# Patient Record
Sex: Male | Born: 1990 | Race: White | Hispanic: No | Marital: Single | Smoking: Never smoker
Health system: Southern US, Community
[De-identification: ages and names within clinical notes are randomized; demographics above are authoritative.]

## PROBLEM LIST (undated history)

## (undated) DIAGNOSIS — K289 Gastrojejunal ulcer, unspecified as acute or chronic, without hemorrhage or perforation: Secondary | ICD-10-CM

---

## 2014-04-01 ENCOUNTER — Emergency Department (HOSPITAL_COMMUNITY): Payer: Federal, State, Local not specified - PPO

## 2014-04-01 ENCOUNTER — Encounter (HOSPITAL_COMMUNITY): Payer: Self-pay | Admitting: Emergency Medicine

## 2014-04-01 ENCOUNTER — Emergency Department (HOSPITAL_COMMUNITY)
Admission: EM | Admit: 2014-04-01 | Discharge: 2014-04-01 | Disposition: A | Discharge status: Home / Self Care | Payer: Federal, State, Local not specified - PPO | Attending: Emergency Medicine | Admitting: Emergency Medicine

## 2014-04-01 DIAGNOSIS — R1110 Vomiting, unspecified: Secondary | ICD-10-CM | POA: Diagnosis not present

## 2014-04-01 DIAGNOSIS — Z8719 Personal history of other diseases of the digestive system: Secondary | ICD-10-CM | POA: Insufficient documentation

## 2014-04-01 DIAGNOSIS — Z79899 Other long term (current) drug therapy: Secondary | ICD-10-CM | POA: Insufficient documentation

## 2014-04-01 HISTORY — DX: Gastrojejunal ulcer, unspecified as acute or chronic, without hemorrhage or perforation: K28.9

## 2014-04-01 LAB — COMPREHENSIVE METABOLIC PANEL
ALT: 152 U/L — ABNORMAL HIGH (ref 0–53)
ANION GAP: 14 (ref 5–15)
AST: 99 U/L — AB (ref 0–37)
Albumin: 5.4 g/dL — ABNORMAL HIGH (ref 3.5–5.2)
Alkaline Phosphatase: 77 U/L (ref 39–117)
BUN: 27 mg/dL — AB (ref 6–23)
CALCIUM: 10.5 mg/dL (ref 8.4–10.5)
CO2: 34 mEq/L — ABNORMAL HIGH (ref 19–32)
Chloride: 93 mEq/L — ABNORMAL LOW (ref 96–112)
Creatinine, Ser: 1.28 mg/dL (ref 0.50–1.35)
GFR calc Af Amer: >90 mL/min (ref >90)
GFR, EST NON AFRICAN AMERICAN: 78 mL/min — AB (ref >90)
Glucose, Bld: 116 mg/dL — ABNORMAL HIGH (ref 70–99)
Potassium: 4.2 mEq/L (ref 3.7–5.3)
Sodium: 141 mEq/L (ref 137–147)
Total Bilirubin: 1 mg/dL (ref 0.3–1.2)
Total Protein: 9 g/dL — ABNORMAL HIGH (ref 6.0–8.3)

## 2014-04-01 LAB — URINALYSIS, ROUTINE W REFLEX MICROSCOPIC
Glucose, UA: NEGATIVE mg/dL
Hgb urine dipstick: NEGATIVE
KETONES UR: NEGATIVE mg/dL
NITRITE: NEGATIVE
PH: 6 (ref 5.0–8.0)
PROTEIN: NEGATIVE mg/dL
Specific Gravity, Urine: 1.029 (ref 1.005–1.030)
UROBILINOGEN UA: 1 mg/dL (ref 0.0–1.0)

## 2014-04-01 LAB — I-STAT CHEM 8, ED
BUN: 29 mg/dL — AB (ref 6–23)
CHLORIDE: 94 meq/L — AB (ref 96–112)
CREATININE: 1.3 mg/dL (ref 0.50–1.35)
Calcium, Ion: 1.07 mmol/L — ABNORMAL LOW (ref 1.12–1.23)
GLUCOSE: 100 mg/dL — AB (ref 70–99)
HCT: 56 % — ABNORMAL HIGH (ref 39.0–52.0)
Hemoglobin: 19 g/dL — ABNORMAL HIGH (ref 13.0–17.0)
POTASSIUM: 3.5 meq/L — AB (ref 3.7–5.3)
Sodium: 138 mEq/L (ref 137–147)
TCO2: 35 mmol/L (ref 0–100)

## 2014-04-01 LAB — URINE MICROSCOPIC-ADD ON

## 2014-04-01 LAB — CBC WITH DIFFERENTIAL/PLATELET
BASOS PCT: 0 % (ref 0–1)
Basophils Absolute: 0 10*3/uL (ref 0.0–0.1)
EOS PCT: 0 % (ref 0–5)
Eosinophils Absolute: 0 10*3/uL (ref 0.0–0.7)
HEMATOCRIT: 47.7 % (ref 39.0–52.0)
Hemoglobin: 16.5 g/dL (ref 13.0–17.0)
LYMPHS PCT: 21 % (ref 12–46)
Lymphs Abs: 1.3 10*3/uL (ref 0.7–4.0)
MCH: 30.4 pg (ref 26.0–34.0)
MCHC: 34.6 g/dL (ref 30.0–36.0)
MCV: 88 fL (ref 78.0–100.0)
MONO ABS: 0.5 10*3/uL (ref 0.1–1.0)
Monocytes Relative: 8 % (ref 3–12)
Neutro Abs: 4.4 10*3/uL (ref 1.7–7.7)
Neutrophils Relative %: 71 % (ref 43–77)
Platelets: 332 10*3/uL (ref 150–400)
RBC: 5.42 MIL/uL (ref 4.22–5.81)
RDW: 12.5 % (ref 11.5–15.5)
WBC: 6.3 10*3/uL (ref 4.0–10.5)

## 2014-04-01 LAB — LIPASE, BLOOD: Lipase: 37 U/L (ref 11–59)

## 2014-04-01 MED ORDER — SODIUM CHLORIDE 0.9 % IV BOLUS (SEPSIS)
1000.0000 mL | Freq: Once | INTRAVENOUS | Status: AC
  Administered 2014-04-01: 1000 mL via INTRAVENOUS

## 2014-04-01 MED ORDER — HYDROCODONE-ACETAMINOPHEN 5-325 MG PO TABS: 1.0000 | ORAL_TABLET | Freq: Four times a day (QID) | ORAL | Status: AC | PRN

## 2014-04-01 MED ORDER — ONDANSETRON HCL 4 MG/2ML IJ SOLN
4.0000 mg | Freq: Once | INTRAMUSCULAR | Status: AC
  Administered 2014-04-01: 4 mg via INTRAVENOUS
  Filled 2014-04-01: qty 2

## 2014-04-01 MED ORDER — SUCRALFATE 1 G PO TABS: 1.0000 g | ORAL_TABLET | Freq: Three times a day (TID) | ORAL | Status: AC

## 2014-04-01 MED ORDER — IOHEXOL 300 MG/ML  SOLN
50.0000 mL | Freq: Once | INTRAMUSCULAR | Status: AC | PRN
  Administered 2014-04-01: 50 mL via ORAL

## 2014-04-01 MED ORDER — OMEPRAZOLE 20 MG PO CPDR: 20.0000 mg | DELAYED_RELEASE_CAPSULE | Freq: Two times a day (BID) | ORAL | Status: AC

## 2014-04-01 MED ORDER — IOHEXOL 300 MG/ML  SOLN
100.0000 mL | Freq: Once | INTRAMUSCULAR | Status: AC | PRN
  Administered 2014-04-01: 100 mL via INTRAVENOUS

## 2014-04-01 MED ORDER — ONDANSETRON HCL 4 MG PO TABS: 4.0000 mg | ORAL_TABLET | Freq: Four times a day (QID) | ORAL | Status: AC

## 2014-04-01 MED ORDER — MORPHINE SULFATE 4 MG/ML IJ SOLN: 4.0000 mg | Freq: Once | INTRAMUSCULAR | Status: DC

## 2014-04-01 NOTE — ED Provider Notes (Signed)
CSN: [id number]     Arrival date & time 04/01/14  1057 History   First MD Initiated Contact with Patient 04/01/14 1105     Chief Complaint  Patient presents with  . Emesis     (Consider location/radiation/quality/duration/timing/severity/associated sxs/prior Treatment) HPI  Patient to the ED with complaints of vomiting, dehydration and inability to void since Thursday (5 days). He says that he has had some mild abdominal pain typically associated with episodes of vomiting. He says that he does okay when he is not eating but anything he tries to eat or drink comes back up. He reports having a hx of the same and being told that he has a stomach ulcer. He is unsure if this feels like the same. Triage nurse notes that he completed a can of Ginger Ale when called to come to the back and through away the empty can. Pt does not appear to be in distress and is resting comfortably.  Vital signs are unremarkable.  Past Medical History  Diagnosis Date  . Ulcer of the stomach and intestine    History reviewed. No pertinent past surgical history. No family history on file. History  Substance Use Topics  . Smoking status: Never Smoker   . Smokeless tobacco: Current User    Types: Chew  . Alcohol Use: Yes     Comment: weekends    Review of Systems  10 Systems reviewed and are negative for acute change except as noted in the HPI.    Allergies  Shellfish allergy  Home Medications   Prior to Admission medications   Medication Sig Start Date End Date Taking? Authorizing Provider  alum & mag hydroxide-simeth (MAALOX/MYLANTA) 200-200-20 MG/5ML suspension Take 30 mLs by mouth every 6 (six) hours as needed for indigestion or heartburn (indigestion).   Yes Historical Provider, MD  ibuprofen (ADVIL,MOTRIN) 200 MG tablet Take 200 mg by mouth 3 (three) times daily as needed for moderate pain (stomach pain).   Yes Historical Provider, MD  methocarbamol (ROBAXIN) 500 MG tablet Take 500 mg by mouth 4  (four) times daily.   Yes Historical Provider, MD  omeprazole (PRILOSEC) 20 MG capsule Take 20 mg by mouth 2 (two) times daily before a meal.   Yes Historical Provider, MD  ondansetron (ZOFRAN-ODT) 8 MG disintegrating tablet Take 8 mg by mouth every 8 (eight) hours as needed for nausea or vomiting (nausea & vomiting).   Yes Historical Provider, MD   BP 110/92 mmHg  Pulse 87  Temp(Src) 98.1 F (36.7 C) (Oral)  Resp 18  Ht 6' (1.829 m)  Wt 149 lb (67.586 kg)  BMI 20.20 kg/m2  SpO2 100% Physical Exam  Constitutional: He appears well-developed and well-nourished. No distress.  HENT:  Head: Normocephalic and atraumatic.  Mouth/Throat: Mucous membranes are dry.  Eyes: Pupils are equal, round, and reactive to light.  Neck: Normal range of motion. Neck supple.  Cardiovascular: Normal rate and regular rhythm.   Pulmonary/Chest: Effort normal.  Abdominal: Soft. Bowel sounds are normal. He exhibits no distension and no fluid wave. There is no tenderness. There is no rigidity, no rebound, no guarding and no CVA tenderness.  Neurological: He is alert.  Skin: Skin is warm and dry.  Nursing note and vitals reviewed.   ED Course  Procedures (including critical care time) Labs Review Labs Reviewed  COMPREHENSIVE METABOLIC PANEL - Abnormal; Notable for the following:    Chloride 93 (*)    CO2 34 (*)    Glucose, Bld 116 (*)  BUN 27 (*)    Total Protein 9.0 (*)    Albumin 5.4 (*)    AST 99 (*)    ALT 152 (*)    GFR calc non Af Amer 78 (*)    All other components within normal limits  URINALYSIS, ROUTINE W REFLEX MICROSCOPIC - Abnormal; Notable for the following:    Color, Urine AMBER (*)    Bilirubin Urine SMALL (*)    Leukocytes, UA TRACE (*)    All other components within normal limits  URINE MICROSCOPIC-ADD ON - Abnormal; Notable for the following:    Bacteria, UA FEW (*)    All other components within normal limits  I-STAT CHEM 8, ED - Abnormal; Notable for the following:     Potassium 3.5 (*)    Chloride 94 (*)    BUN 29 (*)    Glucose, Bld 100 (*)    Calcium, Ion 1.07 (*)    Hemoglobin 19.0 (*)    HCT 56.0 (*)    All other components within normal limits  CBC WITH DIFFERENTIAL  LIPASE, BLOOD    Imaging Review [place] Abdomen Complete  04/01/2014   CLINICAL DATA:  23 year old male with acute vomiting. Acute abdominal pain for 4 days. Initial encounter.  EXAM: ULTRASOUND ABDOMEN COMPLETE  COMPARISON:  None.  FINDINGS: Gallbladder: Contracted. Wall thickness at the upper limits of normal, 3 mm. No pericholecystic fluid. No cholelithiasis identified. No sonographic Murphy sign elicited.  Common bile duct: Diameter: 2 mm, normal  Liver: No focal lesion identified. Within normal limits in parenchymal echogenicity.  IVC: No abnormality visualized.  Pancreas: Visualized portion unremarkable.  Spleen: Size and appearance within normal limits.  Right Kidney: Length: 10.0 cm. Echogenicity within normal limits. No mass or hydronephrosis visualized.  Left Kidney: Length: 11.3 cm. Echogenicity within normal limits. No mass or hydronephrosis visualized.  Abdominal aorta: No aneurysm visualized.  Other findings: None.  IMPRESSION: Contracted but otherwise normal gallbladder. Negative abdominal ultrasound.   Electronically Signed   By: Augusto GambleLee  Hall M.D.   On: 04/01/2014 14:07   ;l  EKG Interpretation None      MDM   Final diagnoses:  Vomiting   12: 30 pm Patient given 2 L of fluids. His CBC and Lipase are unremarkable however his CMP is abnormal. He has elevated LFTs, Albumin is elevated, CO2 is elevated, as well as BUN. Will obtain US abdomen for further evaluation to evaluate pancreas and gallbladder.  2: 59 pm The US of the abdomen is unremarkable. He is now complaining of some suprapubic abdominal pain. Will order CT abd/pelv w/ contrast.  At end of shift patient hand out to [employer] Browning, PA-C.  If the patients CT scan is normal and he is able to tolerate PO and feeling  better I feel that he can go home. If he continues to vomit, cannot tolerate PO or has significant CT abd/pelv findings that I would treat accordingly.    Dorthula Matasiffany G Sherif Millspaugh, PA-C 04/01/14 1502

## 2014-04-01 NOTE — ED Notes (Signed)
Neva SeatGreene, PA notified of pt's comment about his urine. Neva SeatGreene, PA at bedside.

## 2014-04-01 NOTE — Discharge Instructions (Signed)

## 2014-04-01 NOTE — ED Notes (Signed)
Pt states he is unable to give urine sample at this time due to the fact he has not been able to keep water down. Pt states yesterday he urinated a few drops of brown urine.

## 2014-04-01 NOTE — ED Provider Notes (Signed)
3:40 PM Patient signed out to me by Neva SeatGreene, PA-C.  Patient with RLQ pain and vomiting.  CT abd is pending.  DC if negative and tolerating oral intake.  4:25 PM Patient reassessed.  Feeling better.  Abdomen is soft and non tender.  Patient has history of ulcers, states that this feels similar.  Will DC with carafate, omeprazole, and zofran.  Patient is tolerating orals here.  DC to home.  Roxy Horsemanobert Dahiana Kulak, PA-C 04/01/14 1625

## 2014-04-01 NOTE — ED Notes (Signed)
Pt states that he hasnt been able to tolerate water or foods since Thursday night. Pt finished off ginger ale and threw in trash when called pt from lobby.  Pt denies diarrhea.

## 2015-06-21 IMAGING — CT CT ABD-PELV W/ CM
1 of 2 series · 15 of 32 positions shown, 19 images · IV contrast (OMNIPAQUE 300)
Comparison: Ultrasound of the abdomen performed earlier today.

CLINICAL DATA: Vomiting, dehydration, and inability to void. Mid
abdominal pain.

EXAM:
CT ABDOMEN AND PELVIS WITH CONTRAST
TECHNIQUE: Multidetector CT imaging of the abdomen and pelvis was performed
using the standard protocol following bolus administration of
intravenous contrast.
CONTRAST:  50mL OMNIPAQUE IOHEXOL 300 MG/ML SOLN, 100mL OMNIPAQUE
IOHEXOL 300 MG/ML SOLN

[Series 2: abd/pel with · axial · 0.69mm/px · z∈[-486,-71]mm · 15 of 91 slices shown, 19 images]
[im 4/91  soft-tissue]
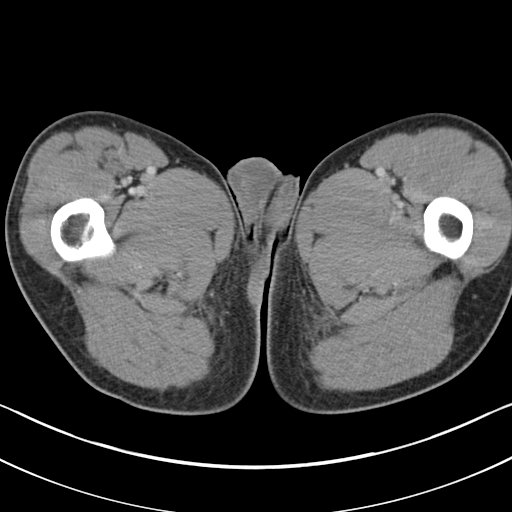
[im 4/91  bone]
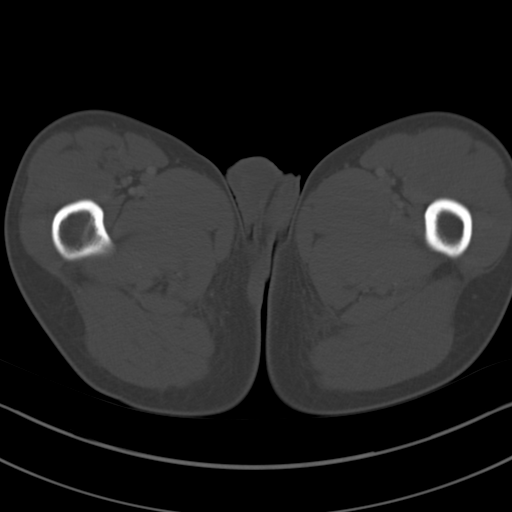
[im 11/91  soft-tissue]
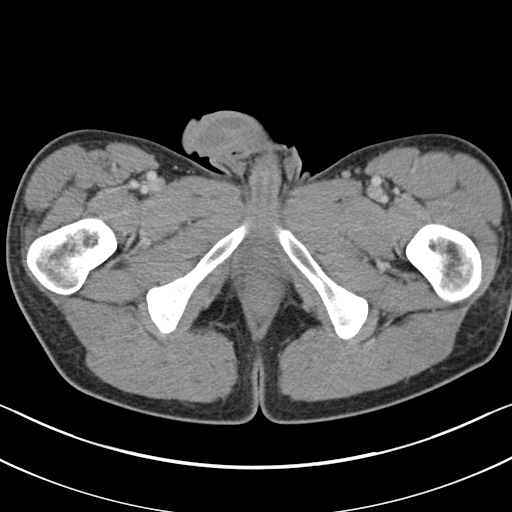
[im 19/91  soft-tissue]
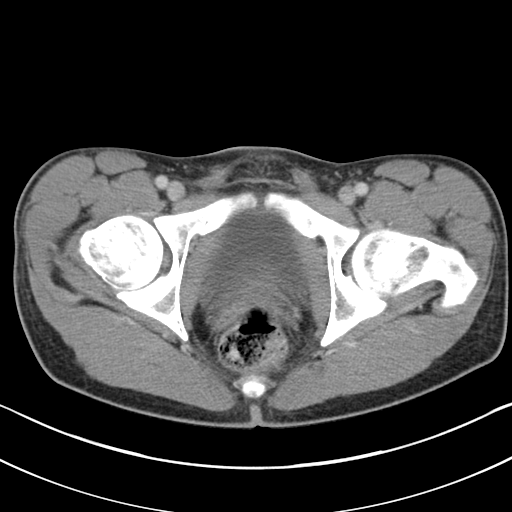
[im 26/91  soft-tissue]
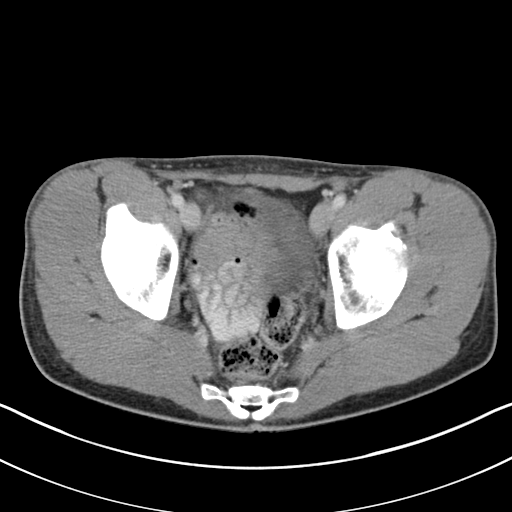
[im 33/91  soft-tissue]
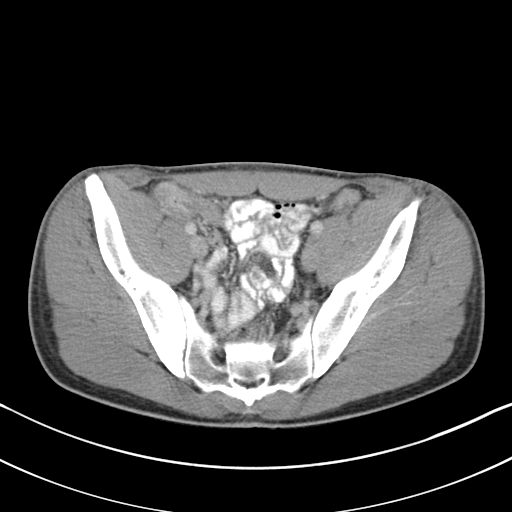
[im 40/91  soft-tissue]
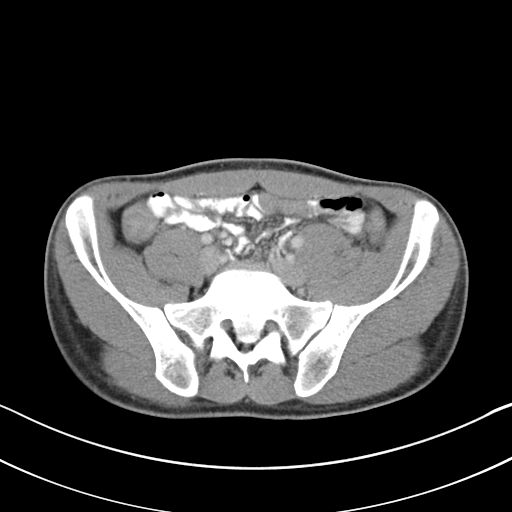
[im 47/91  soft-tissue]
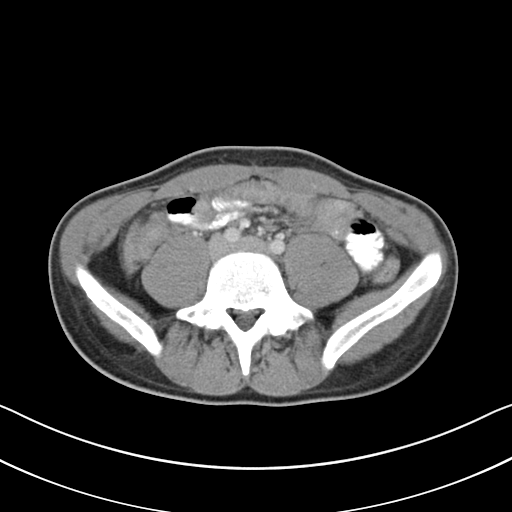
[im 51/91  soft-tissue]
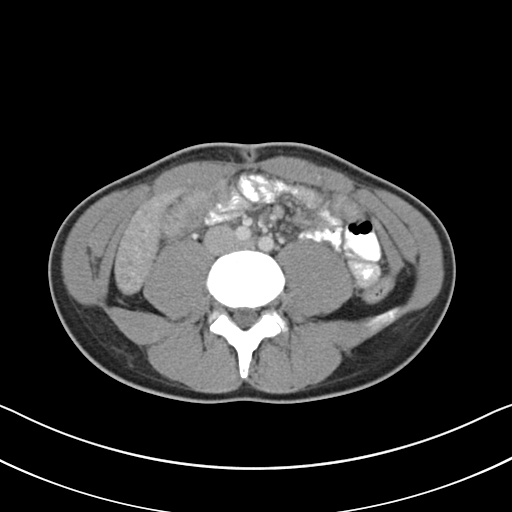
[im 58/91  soft-tissue]
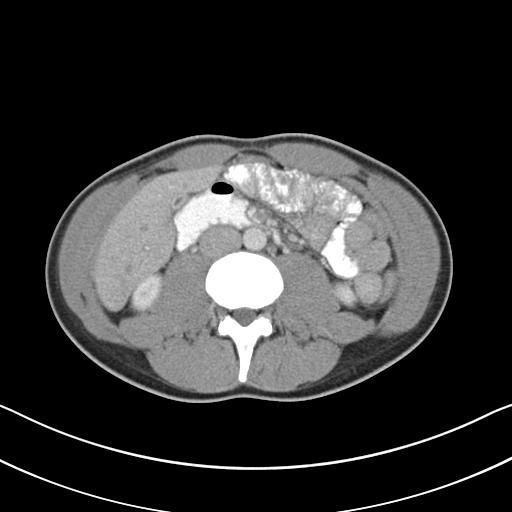
[im 58/91  bone]
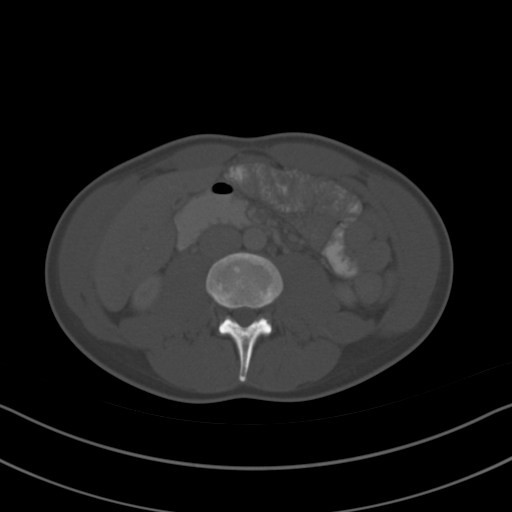
[im 65/91  soft-tissue]
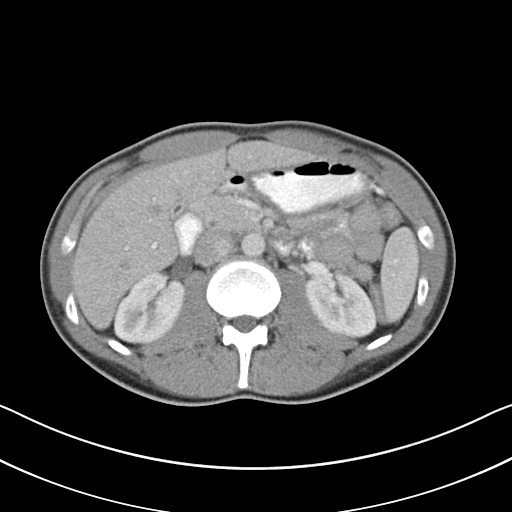
[im 73/91  soft-tissue]
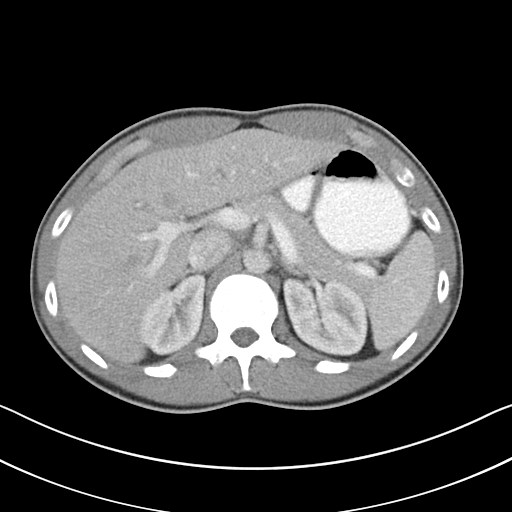
[im 76/91  lung]
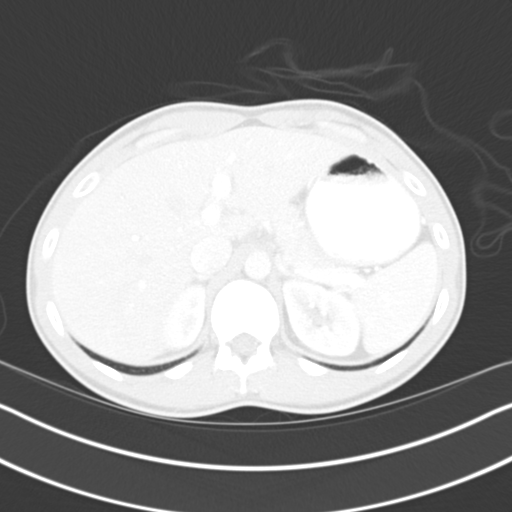
[im 80/91  soft-tissue]
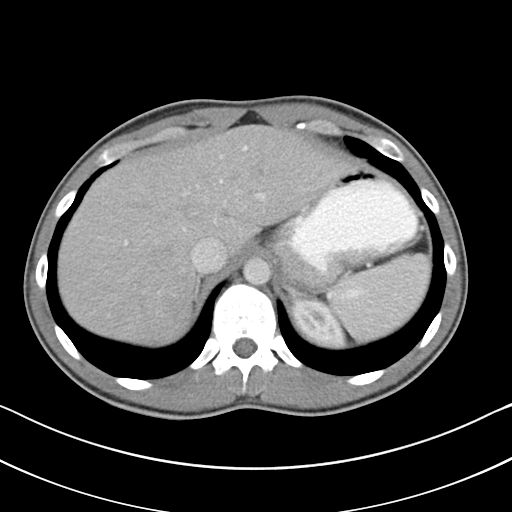
[im 80/91  lung]
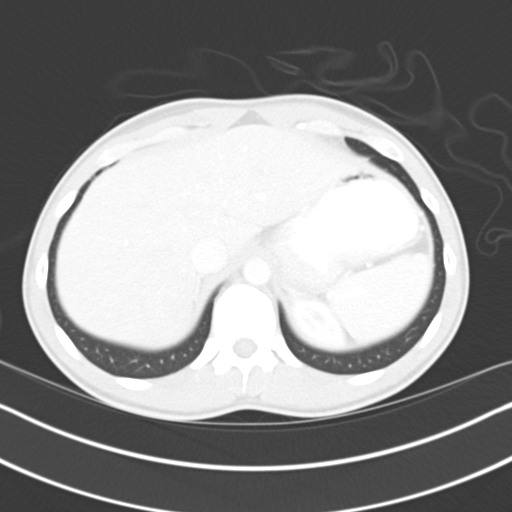
[im 83/91  lung]
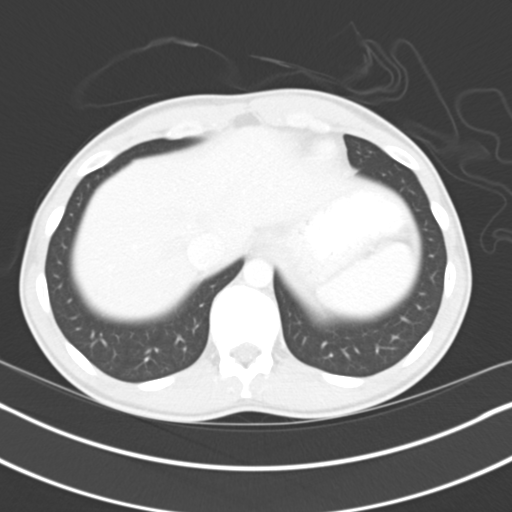
[im 87/91  soft-tissue]
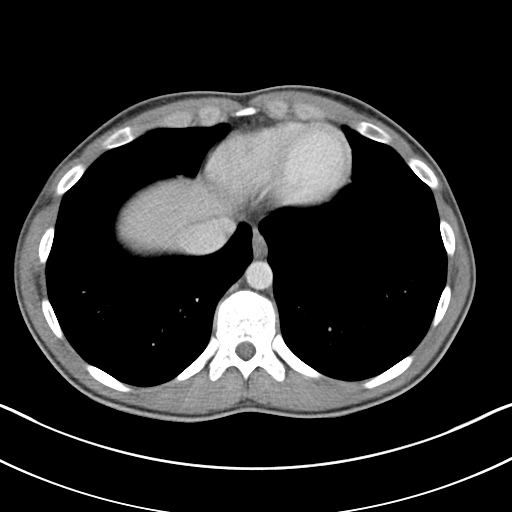
[im 87/91  lung]
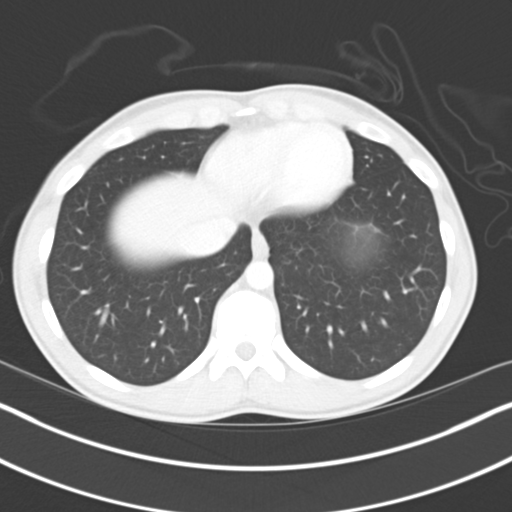

[15 of 32 positions shown; findings below may reference images not displayed]

FINDINGS: BODY WALL: Unremarkable.

LOWER CHEST: Unremarkable.

ABDOMEN/PELVIS:

Liver: No focal abnormality.

Biliary: No evidence of biliary obstruction or stone. Gallbladder
contracted similar to prior ultrasound appearance.

Pancreas: Unremarkable.

Spleen: Unremarkable.

Adrenals: Unremarkable.

Kidneys and ureters: No hydronephrosis or stone.

Bladder: Unremarkable.

Reproductive: Unremarkable.

Bowel: No obstruction. No appendiceal inflammation is evident.

Retroperitoneum: No mass or adenopathy.

Peritoneum: No free fluid or gas.

Vascular: No acute abnormality.

OSSEOUS: No acute abnormalities. Minor wedging of T12 with shallow
calcific T11-12 protrusion, appear chronic.
IMPRESSION: Negative exam.
